# Patient Record
Sex: Male | Born: 2000
Health system: Southern US, Community
[De-identification: ages and names within clinical notes are randomized; demographics above are authoritative.]

## PROBLEM LIST (undated history)

## (undated) DIAGNOSIS — F609 Personality disorder, unspecified: Secondary | ICD-10-CM

## (undated) DIAGNOSIS — F458 Other somatoform disorders: Secondary | ICD-10-CM

## (undated) DIAGNOSIS — F429 Obsessive-compulsive disorder, unspecified: Secondary | ICD-10-CM

## (undated) DIAGNOSIS — J309 Allergic rhinitis, unspecified: Secondary | ICD-10-CM

## (undated) DIAGNOSIS — F32A Depression, unspecified: Secondary | ICD-10-CM

## (undated) DIAGNOSIS — R5383 Other fatigue: Secondary | ICD-10-CM

## (undated) DIAGNOSIS — L209 Atopic dermatitis, unspecified: Secondary | ICD-10-CM

## (undated) DIAGNOSIS — F329 Major depressive disorder, single episode, unspecified: Secondary | ICD-10-CM

## (undated) DIAGNOSIS — F909 Attention-deficit hyperactivity disorder, unspecified type: Secondary | ICD-10-CM

## (undated) DIAGNOSIS — F419 Anxiety disorder, unspecified: Secondary | ICD-10-CM

## (undated) DIAGNOSIS — R519 Headache, unspecified: Secondary | ICD-10-CM

## (undated) HISTORY — DX: Atopic dermatitis, unspecified: L20.9

## (undated) HISTORY — DX: Other somatoform disorders: F45.8

## (undated) HISTORY — DX: Obsessive-compulsive disorder, unspecified: F42.9

## (undated) HISTORY — DX: Anxiety disorder, unspecified: F41.9

## (undated) HISTORY — DX: Allergic rhinitis, unspecified: J30.9

## (undated) HISTORY — DX: Depression, unspecified: F32.A

## (undated) HISTORY — DX: Headache, unspecified: R51.9

## (undated) HISTORY — DX: Other fatigue: R53.83

## (undated) HISTORY — DX: Personality disorder, unspecified: F60.9

## (undated) HISTORY — DX: Attention-deficit hyperactivity disorder, unspecified type: F90.9

---
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
## 2021-01-11 ENCOUNTER — Ambulatory Visit: Payer: No Typology Code available for payment source | Admitting: Adult Health

## 2021-01-12 ENCOUNTER — Ambulatory Visit: Payer: No Typology Code available for payment source | Admitting: Adult Health

## 2021-02-01 DIAGNOSIS — R197 Diarrhea, unspecified: Secondary | ICD-10-CM | POA: Diagnosis not present

## 2021-02-01 DIAGNOSIS — A084 Viral intestinal infection, unspecified: Secondary | ICD-10-CM | POA: Diagnosis not present

## 2021-02-12 ENCOUNTER — Ambulatory Visit (INDEPENDENT_AMBULATORY_CARE_PROVIDER_SITE_OTHER): Payer: No Typology Code available for payment source | Admitting: Adult Health

## 2021-02-12 ENCOUNTER — Other Ambulatory Visit: Payer: Self-pay

## 2021-02-12 ENCOUNTER — Encounter: Payer: Self-pay | Admitting: Adult Health

## 2021-02-12 DIAGNOSIS — F605 Obsessive-compulsive personality disorder: Secondary | ICD-10-CM | POA: Diagnosis not present

## 2021-02-12 DIAGNOSIS — F900 Attention-deficit hyperactivity disorder, predominantly inattentive type: Secondary | ICD-10-CM

## 2021-02-12 DIAGNOSIS — F341 Dysthymic disorder: Secondary | ICD-10-CM | POA: Diagnosis not present

## 2021-02-12 DIAGNOSIS — R69 Illness, unspecified: Secondary | ICD-10-CM | POA: Diagnosis not present

## 2021-02-12 MED ORDER — AMPHETAMINE-DEXTROAMPHETAMINE 20 MG PO TABS: 20.0000 mg | ORAL_TABLET | Freq: Two times a day (BID) | ORAL | 0 refills | Status: DC

## 2021-02-12 MED ORDER — DESVENLAFAXINE SUCCINATE ER 50 MG PO TB24: 100.0000 mg | ORAL_TABLET | Freq: Every day | ORAL | 5 refills | Status: DC

## 2021-02-12 NOTE — Progress Notes (Signed)
Justin Hogan [id number] 2001/05/10 20 y.o.  Subjective:   Patient ID:  Justin Hogan is a 20 y.o. (DOB 09/26/2000) male.  Chief Complaint: No chief complaint on file.   HPI [place] presents to the office today for follow-up of ADHD, OCPD, learning disorder, depression.   Describes mood today as "ok". Pleasant. Mood symptoms - denies depression, anxiety, and irritability. Stating "I feel like things are tolerable right now". Decreased emotional "blunting" with decrease in Pristiq. Recent vacation - 22 day back packing trip - team building exercises. Stable interest and motivation. Taking medications as prescribed.  Energy levels "about the same". Active, has a regular exercise routine - running - hiking. Enjoys some usual interests and activities. Dating. Lives at home with mother. Family in [place]. Spending time with family. Appetite adequate. Weight stable - 197 pounds - 75". Sleeps better some nights than others. Averages 6 to 8 hours.  Focus and concentration stable. Completing tasks. Managing aspects of household. Recently graduated high school. 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  Denies AH or VH.  Previous medication trials: Vyvanse      Review of Systems:  Review of Systems  Musculoskeletal:  Negative for gait problem.  Neurological:  Negative for tremors.  Psychiatric/Behavioral:         Please refer to HPI   Medications: I have reviewed the patient's current medications.  Current Outpatient Medications  Medication Sig Dispense Refill   amphetamine-dextroamphetamine (ADDERALL) 20 MG tablet Take 1 tablet (20 mg total) by mouth 2 (two) times daily with breakfast and lunch. 60 tablet 0   [START ON 03/12/2021] amphetamine-dextroamphetamine (ADDERALL) 20 MG tablet Take 1 tablet (20 mg total) by mouth 2 (two) times daily with breakfast and lunch. 60 tablet 0   [START ON 04/09/2021] amphetamine-dextroamphetamine (ADDERALL) 20 MG  tablet Take 1 tablet (20 mg total) by mouth 2 (two) times daily with breakfast and lunch. 60 tablet 0   desvenlafaxine (PRISTIQ) 50 MG 24 hr tablet Take 2 tablets (100 mg total) by mouth daily after breakfast. 30 tablet 5   No current facility-administered medications for this visit.    Medication Side Effects: None  Allergies: No Known Allergies  Past Medical History:  Diagnosis Date   ADHD (attention deficit hyperactivity disorder)    Allergic rhinitis    Anxiety    Atopic eczema    Depression    Fatigue    Headache    Obsessive-compulsive disorder    Personality disorder (HCC)    Psychogenic constipation    Sinus headache     Past Medical History, Surgical history, Social history, and Family history were reviewed and updated as appropriate.   Please see review of systems for further details on the patient's review from today.   Objective:   Physical Exam:  There were no vitals taken for this visit.  Physical Exam Constitutional:      General: He is not in acute distress. Musculoskeletal:        General: No deformity.  Neurological:     Mental Status: He is alert and oriented to person, place, and time.     Coordination: Coordination normal.  Psychiatric:        Attention and Perception: Attention and perception normal. He does not perceive auditory or visual hallucinations.        Mood and Affect: Mood normal. Mood is not anxious or depressed. Affect is not labile, blunt, angry or inappropriate.  Speech: Speech normal.        Behavior: Behavior normal.        Thought Content: Thought content normal. Thought content is not paranoid or delusional. Thought content does not include homicidal or suicidal ideation. Thought content does not include homicidal or suicidal plan.        Cognition and Memory: Cognition and memory normal.        Judgment: Judgment normal.     Comments: Insight intact    Lab Review:  No results found for: NA, K, CL, CO2, GLUCOSE, BUN,  CREATININE, CALCIUM, PROT, ALBUMIN, AST, ALT, ALKPHOS, BILITOT, GFRNONAA, GFRAA  No results found for: WBC, RBC, HGB, HCT, PLT, MCV, MCH, MCHC, RDW, LYMPHSABS, MONOABS, EOSABS, BASOSABS  No results found for: POCLITH, LITHIUM   No results found for: PHENYTOIN, PHENOBARB, VALPROATE, CBMZ   .res Assessment: Plan:    Plan:  PDMP reviewed  1. Adderall 20mg  BID - taking as needed 2. Pristiq 50mg  daily  91/49 - 69  RTC 3 months  Patient advised to contact office with any questions, adverse effects, or acute worsening in signs and symptoms.  Discussed potential benefits, risk, and side effects of benzodiazepines to include potential risk of tolerance and dependence, as well as possible drowsiness.  Advised patient not to drive if experiencing drowsiness and to take lowest possible effective dose to minimize risk of dependence and tolerance.  Diagnoses and all orders for this visit:  Attention deficit hyperactivity disorder (ADHD), inattentive type, moderate -     desvenlafaxine (PRISTIQ) 50 MG 24 hr tablet; Take 2 tablets (100 mg total) by mouth daily after breakfast. -     amphetamine-dextroamphetamine (ADDERALL) 20 MG tablet; Take 1 tablet (20 mg total) by mouth 2 (two) times daily with breakfast and lunch. -     amphetamine-dextroamphetamine (ADDERALL) 20 MG tablet; Take 1 tablet (20 mg total) by mouth 2 (two) times daily with breakfast and lunch. -     amphetamine-dextroamphetamine (ADDERALL) 20 MG tablet; Take 1 tablet (20 mg total) by mouth 2 (two) times daily with breakfast and lunch.  Persistent depressive disorder with anxious distress, currently moderate -     desvenlafaxine (PRISTIQ) 50 MG 24 hr tablet; Take 2 tablets (100 mg total) by mouth daily after breakfast.  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    Please see After Visit Summary  for patient specific instructions.  No future appointments.  No orders of the defined types were placed in this encounter.   -------------------------------

## 2021-05-14 ENCOUNTER — Other Ambulatory Visit: Payer: Self-pay

## 2021-05-14 ENCOUNTER — Ambulatory Visit (INDEPENDENT_AMBULATORY_CARE_PROVIDER_SITE_OTHER): Payer: No Typology Code available for payment source | Admitting: Adult Health

## 2021-05-14 ENCOUNTER — Encounter: Payer: Self-pay | Admitting: Adult Health

## 2021-05-14 DIAGNOSIS — F810 Specific reading disorder: Secondary | ICD-10-CM | POA: Diagnosis not present

## 2021-05-14 DIAGNOSIS — F341 Dysthymic disorder: Secondary | ICD-10-CM | POA: Diagnosis not present

## 2021-05-14 DIAGNOSIS — F605 Obsessive-compulsive personality disorder: Secondary | ICD-10-CM | POA: Diagnosis not present

## 2021-05-14 DIAGNOSIS — F900 Attention-deficit hyperactivity disorder, predominantly inattentive type: Secondary | ICD-10-CM

## 2021-05-14 DIAGNOSIS — R69 Illness, unspecified: Secondary | ICD-10-CM | POA: Diagnosis not present

## 2021-05-14 MED ORDER — AMPHETAMINE-DEXTROAMPHETAMINE 20 MG PO TABS: 20.0000 mg | ORAL_TABLET | Freq: Two times a day (BID) | ORAL | 0 refills | Status: DC

## 2021-05-14 MED ORDER — DESVENLAFAXINE SUCCINATE ER 50 MG PO TB24: 100.0000 mg | ORAL_TABLET | Freq: Every day | ORAL | 5 refills | Status: DC

## 2021-05-14 NOTE — Progress Notes (Signed)
Justin Hogan [id number] 08-Apr-2001 20 y.o.  Subjective:   Patient ID:  Justin Hogan is a 20 y.o. (DOB Aug 16, 2000) male.  Chief Complaint: No chief complaint on file.   HPI [place] K[place] presents to the office today for follow-up of ADHD, OCPD, learning disorder, depression.  Describes mood today as "ok". Pleasant. Mood symptoms - reports depression - sometimes, anxiety - a little bit, and irritability - not so much. Reports intrusive thoughts. Mood is "level". Stating "I think I'm doing ok". Feels like medications continue to work well - using Adderall as needed for class work. Struggling with figuring out what makes him satisfied with his life. Hears other talking about the fun they have. Doesn't have a lot of friends - making a goal to meet people. Stable interest and motivation. Taking medications as prescribed.  Energy levels "about the same". Active, has a regular exercise routine - running - hiking. Enjoys some usual interests and activities. Dating. Lives at home with mother. [place] in [place]. Spending time with family. Appetite adequate. Weight stable - 200 pounds - 75". Sleeps better some nights than others. Averages 6 to 8 hours.  Focus and concentration stable. Completing tasks. Managing aspects of household. 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 Attends [employer] school. 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  Denies AH or VH.  Previous medication trials: Vyvanse   Review of Systems:  Review of Systems  Musculoskeletal:  Negative for gait problem.  Neurological:  Negative for tremors.  Psychiatric/Behavioral:         Please refer to HPI   Medications: I have reviewed the patient's current medications.  Current Outpatient Medications  Medication Sig Dispense Refill   amphetamine-dextroamphetamine (ADDERALL) 20 MG tablet Take 1 tablet (20 mg total) by mouth 2 (two) times daily with breakfast and lunch. 60 tablet 0   [START ON 06/11/2021] amphetamine-dextroamphetamine  (ADDERALL) 20 MG tablet Take 1 tablet (20 mg total) by mouth 2 (two) times daily with breakfast and lunch. 60 tablet 0   [START ON 07/09/2021] amphetamine-dextroamphetamine (ADDERALL) 20 MG tablet Take 1 tablet (20 mg total) by mouth 2 (two) times daily with breakfast and lunch. 60 tablet 0   desvenlafaxine (PRISTIQ) 50 MG 24 hr tablet Take 2 tablets (100 mg total) by mouth daily after breakfast. 30 tablet 5   No current facility-administered medications for this visit.    Medication Side Effects: None  Allergies: No Known Allergies  Past Medical History:  Diagnosis Date   ADHD (attention deficit hyperactivity disorder)    Allergic rhinitis    Anxiety    Atopic eczema    Depression    Fatigue    Headache    Obsessive-compulsive disorder    Personality disorder (HCC)    Psychogenic constipation    Sinus headache     Past Medical History, Surgical history, Social history, and Family history were reviewed and updated as appropriate.   Please see review of systems for further details on the patient's review from today.   Objective:   Physical Exam:  There were no vitals taken for this visit.  Physical Exam Constitutional:      General: He is not in acute distress. Musculoskeletal:        General: No deformity.  Neurological:     Mental Status: He is alert and oriented to person, place, and time.     Coordination: Coordination normal.  Psychiatric:        Attention and Perception: Attention and perception normal. He does not perceive  auditory or visual hallucinations.        Mood and Affect: Mood normal. Mood is not anxious or depressed. Affect is not labile, blunt, angry or inappropriate.        Speech: Speech normal.        Behavior: Behavior normal.        Thought Content: Thought content normal. Thought content is not paranoid or delusional. Thought content does not include homicidal or suicidal ideation. Thought content does not include homicidal or suicidal plan.         Cognition and Memory: Cognition and memory normal.        Judgment: Judgment normal.     Comments: Insight intact    Lab Review:  No results found for: NA, K, CL, CO2, GLUCOSE, BUN, CREATININE, CALCIUM, PROT, ALBUMIN, AST, ALT, ALKPHOS, BILITOT, GFRNONAA, GFRAA  No results found for: WBC, RBC, HGB, HCT, PLT, MCV, MCH, MCHC, RDW, LYMPHSABS, MONOABS, EOSABS, BASOSABS  No results found for: POCLITH, LITHIUM   No results found for: PHENYTOIN, PHENOBARB, VALPROATE, CBMZ   .res Assessment: Plan:    Plan:  PDMP reviewed  1. Adderall 20mg  BID - taking as needed 2. Pristiq 50mg  daily  101/70/63  RTC 3 months  Patient advised to contact office with any questions, adverse effects, or acute worsening in signs and symptoms.  Discussed potential benefits, risks, and side effects of stimulants with patient to include increased heart rate, palpitations, insomnia, increased anxiety, increased irritability, or decreased appetite.  Instructed patient to contact office if experiencing any significant tolerability issues.   Diagnoses and all orders for this visit:  Specific learning disorder with reading impairment  Attention deficit hyperactivity disorder (ADHD), inattentive type, moderate -     amphetamine-dextroamphetamine (ADDERALL) 20 MG tablet; Take 1 tablet (20 mg total) by mouth 2 (two) times daily with breakfast and lunch. -     amphetamine-dextroamphetamine (ADDERALL) 20 MG tablet; Take 1 tablet (20 mg total) by mouth 2 (two) times daily with breakfast and lunch. -     amphetamine-dextroamphetamine (ADDERALL) 20 MG tablet; Take 1 tablet (20 mg total) by mouth 2 (two) times daily with breakfast and lunch. -     desvenlafaxine (PRISTIQ) 50 MG 24 hr tablet; Take 2 tablets (100 mg total) by mouth daily after breakfast.  Persistent depressive disorder with anxious distress, currently moderate -     desvenlafaxine (PRISTIQ) 50 MG 24 hr tablet; Take 2 tablets (100 mg total) by mouth daily  after breakfast.  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    Please see After Visit Summary for patient specific instructions.  No future appointments.  No orders of the defined types were placed in this encounter.   -------------------------------

## 2021-06-14 DIAGNOSIS — R69 Illness, unspecified: Secondary | ICD-10-CM | POA: Diagnosis not present

## 2021-06-14 DIAGNOSIS — J309 Allergic rhinitis, unspecified: Secondary | ICD-10-CM | POA: Diagnosis not present

## 2021-06-14 DIAGNOSIS — E559 Vitamin D deficiency, unspecified: Secondary | ICD-10-CM | POA: Diagnosis not present

## 2021-06-14 DIAGNOSIS — H6692 Otitis media, unspecified, left ear: Secondary | ICD-10-CM | POA: Diagnosis not present

## 2021-06-14 DIAGNOSIS — Z0001 Encounter for general adult medical examination with abnormal findings: Secondary | ICD-10-CM | POA: Diagnosis not present

## 2021-07-06 DIAGNOSIS — E039 Hypothyroidism, unspecified: Secondary | ICD-10-CM | POA: Diagnosis not present

## 2021-08-13 ENCOUNTER — Encounter: Payer: Self-pay | Admitting: Adult Health

## 2021-08-13 ENCOUNTER — Other Ambulatory Visit: Payer: Self-pay

## 2021-08-13 ENCOUNTER — Ambulatory Visit (INDEPENDENT_AMBULATORY_CARE_PROVIDER_SITE_OTHER): Payer: No Typology Code available for payment source | Admitting: Adult Health

## 2021-08-13 DIAGNOSIS — F900 Attention-deficit hyperactivity disorder, predominantly inattentive type: Secondary | ICD-10-CM

## 2021-08-13 DIAGNOSIS — F605 Obsessive-compulsive personality disorder: Secondary | ICD-10-CM

## 2021-08-13 DIAGNOSIS — F810 Specific reading disorder: Secondary | ICD-10-CM | POA: Diagnosis not present

## 2021-08-13 DIAGNOSIS — R69 Illness, unspecified: Secondary | ICD-10-CM | POA: Diagnosis not present

## 2021-08-13 DIAGNOSIS — F341 Dysthymic disorder: Secondary | ICD-10-CM | POA: Diagnosis not present

## 2021-08-13 MED ORDER — DESVENLAFAXINE SUCCINATE ER 50 MG PO TB24: ORAL_TABLET | ORAL | 5 refills | Status: DC

## 2021-08-13 MED ORDER — AMPHETAMINE-DEXTROAMPHETAMINE 20 MG PO TABS: 20.0000 mg | ORAL_TABLET | Freq: Two times a day (BID) | ORAL | 0 refills | Status: DC

## 2021-08-13 NOTE — Progress Notes (Signed)
Justin Hogan [id number] 2001/06/16 21 y.o.  Subjective:   Patient ID:  Justin Hogan is a 21 y.o. (DOB 21-Mar-2001) male.  Chief Complaint: No chief complaint on file.   HPI Mirac Mctiernan presents to the office today for follow-up of ADHD, OCPD, learning disorder, depression.   Describes mood today as "ok". Pleasant. Mood symptoms - reports seasonal depression, anxiety, and irritability. Stating "I have been somewhat down". Reports some emotional blunting. Continues to take the Pristiq 50mg  every morning and Adderall. Starting classes at [employer] on January 09. Mother has taken a job that will require her to move. Cousin planning to move in with him. Varying interest and motivation. Taking medications as prescribed.  Energy levels lower. Active, does not have a regular exercise routine - plans to start. Enjoys some usual interests and activities. Lives at home with mother. Family in [place]. Spending time with family. Appetite adequate. Weight stable -215 pounds - 75". Sleeps better some nights than others. Averages 6 to 8 hours.  Focus and concentration stable. Completing tasks. Managing aspects of household. High school graduate. 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  Denies AH or VH.  Previous medication trials: Vyvanse  Review of Systems:  Review of Systems  Musculoskeletal:  Negative for gait problem.  Neurological:  Negative for tremors.  Psychiatric/Behavioral:         Please refer to HPI   Medications: I have reviewed the patient's current medications.  Current Outpatient Medications  Medication Sig Dispense Refill   amphetamine-dextroamphetamine (ADDERALL) 20 MG tablet Take 1 tablet (20 mg total) by mouth 2 (two) times daily with breakfast and lunch. 60 tablet 0   [START ON 09/10/2021] amphetamine-dextroamphetamine (ADDERALL) 20 MG tablet Take 1 tablet (20 mg total) by mouth 2 (two) times daily with breakfast and lunch. 60 tablet 0   [START ON 10/08/2021]  amphetamine-dextroamphetamine (ADDERALL) 20 MG tablet Take 1 tablet (20 mg total) by mouth 2 (two) times daily with breakfast and lunch. 60 tablet 0   desvenlafaxine (PRISTIQ) 50 MG 24 hr tablet Take one tablet every morning. 30 tablet 5   No current facility-administered medications for this visit.    Medication Side Effects: None  Allergies: No Known Allergies  Past Medical History:  Diagnosis Date   ADHD (attention deficit hyperactivity disorder)    Allergic rhinitis    Anxiety    Atopic eczema    Depression    Fatigue    Headache    Obsessive-compulsive disorder    Personality disorder (HCC)    Psychogenic constipation    Sinus headache     Past Medical History, Surgical history, Social history, and Family history were reviewed and updated as appropriate.   Please see review of systems for further details on the patient's review from today.   Objective:   Physical Exam:  There were no vitals taken for this visit.  Physical Exam Constitutional:      General: He is not in acute distress. Musculoskeletal:        General: No deformity.  Neurological:     Mental Status: He is alert and oriented to person, place, and time.     Coordination: Coordination normal.  Psychiatric:        Attention and Perception: Attention and perception normal. He does not perceive auditory or visual hallucinations.        Mood and Affect: Mood normal. Mood is not anxious or depressed. Affect is not labile, blunt, angry or inappropriate.  Speech: Speech normal.        Behavior: Behavior normal.        Thought Content: Thought content normal. Thought content is not paranoid or delusional. Thought content does not include homicidal or suicidal ideation. Thought content does not include homicidal or suicidal plan.        Cognition and Memory: Cognition and memory normal.        Judgment: Judgment normal.     Comments: Insight intact    Lab Review:  No results found for: NA, K, CL,  CO2, GLUCOSE, BUN, CREATININE, CALCIUM, PROT, ALBUMIN, AST, ALT, ALKPHOS, BILITOT, GFRNONAA, GFRAA  No results found for: WBC, RBC, HGB, HCT, PLT, MCV, MCH, MCHC, RDW, LYMPHSABS, MONOABS, EOSABS, BASOSABS  No results found for: POCLITH, LITHIUM   No results found for: PHENYTOIN, PHENOBARB, VALPROATE, CBMZ   .res Assessment: Plan:    Plan:  PDMP reviewed  1. Adderall 20mg  BID - taking as needed 2. Pristiq 50mg  daily  107/55/64  RTC 3 months  Patient advised to contact office with any questions, adverse effects, or acute worsening in signs and symptoms.  Discussed potential benefits, risks, and side effects of stimulants with patient to include increased heart rate, palpitations, insomnia, increased anxiety, increased irritability, or decreased appetite.  Instructed patient to contact office if experiencing any significant tolerability issues.  Diagnoses and all orders for this visit:  Specific learning disorder with reading impairment  Attention deficit hyperactivity disorder (ADHD), inattentive type, moderate -     amphetamine-dextroamphetamine (ADDERALL) 20 MG tablet; Take 1 tablet (20 mg total) by mouth 2 (two) times daily with breakfast and lunch. -     amphetamine-dextroamphetamine (ADDERALL) 20 MG tablet; Take 1 tablet (20 mg total) by mouth 2 (two) times daily with breakfast and lunch. -     amphetamine-dextroamphetamine (ADDERALL) 20 MG tablet; Take 1 tablet (20 mg total) by mouth 2 (two) times daily with breakfast and lunch. -     desvenlafaxine (PRISTIQ) 50 MG 24 hr tablet; Take one tablet every morning.  Persistent depressive disorder with anxious distress, currently moderate -     desvenlafaxine (PRISTIQ) 50 MG 24 hr tablet; Take one tablet every morning.  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     Please see After Visit Summary for patient specific  instructions.  No future appointments.  No orders of the defined types were placed in this encounter.   -------------------------------

## 2021-08-15 ENCOUNTER — Telehealth: Payer: Self-pay

## 2021-08-15 NOTE — Telephone Encounter (Signed)
Prior Authorization submitted and approved for DESVENLAFAXINE 50 MG #30 effective 08/15/2021-08/15/2022 with [facility]

## 2021-09-22 DIAGNOSIS — J020 Streptococcal pharyngitis: Secondary | ICD-10-CM | POA: Diagnosis not present

## 2021-09-27 DIAGNOSIS — E039 Hypothyroidism, unspecified: Secondary | ICD-10-CM | POA: Diagnosis not present

## 2021-10-04 DIAGNOSIS — J029 Acute pharyngitis, unspecified: Secondary | ICD-10-CM | POA: Diagnosis not present

## 2021-10-04 DIAGNOSIS — J0120 Acute ethmoidal sinusitis, unspecified: Secondary | ICD-10-CM | POA: Diagnosis not present

## 2021-10-04 DIAGNOSIS — J309 Allergic rhinitis, unspecified: Secondary | ICD-10-CM | POA: Diagnosis not present

## 2021-10-04 DIAGNOSIS — Z20822 Contact with and (suspected) exposure to covid-19: Secondary | ICD-10-CM | POA: Diagnosis not present

## 2021-10-04 DIAGNOSIS — R69 Illness, unspecified: Secondary | ICD-10-CM | POA: Diagnosis not present

## 2021-11-11 ENCOUNTER — Ambulatory Visit (INDEPENDENT_AMBULATORY_CARE_PROVIDER_SITE_OTHER): Payer: No Typology Code available for payment source | Admitting: Adult Health

## 2021-11-11 ENCOUNTER — Encounter: Payer: Self-pay | Admitting: Adult Health

## 2021-11-11 DIAGNOSIS — F900 Attention-deficit hyperactivity disorder, predominantly inattentive type: Secondary | ICD-10-CM

## 2021-11-11 DIAGNOSIS — R69 Illness, unspecified: Secondary | ICD-10-CM | POA: Diagnosis not present

## 2021-11-11 DIAGNOSIS — F605 Obsessive-compulsive personality disorder: Secondary | ICD-10-CM

## 2021-11-11 DIAGNOSIS — F341 Dysthymic disorder: Secondary | ICD-10-CM

## 2021-11-11 MED ORDER — AMPHETAMINE-DEXTROAMPHETAMINE 20 MG PO TABS: 20.0000 mg | ORAL_TABLET | Freq: Two times a day (BID) | ORAL | 0 refills | Status: DC

## 2021-11-11 MED ORDER — DESVENLAFAXINE SUCCINATE ER 50 MG PO TB24: ORAL_TABLET | ORAL | 5 refills | Status: DC

## 2021-11-11 NOTE — Progress Notes (Signed)
Justin Hogan ?[id number] ?2001-06-14 ?21 y.o. ? ?Subjective:  ? ?Patient ID:  Justin Hogan is a 21 y.o. (DOB Jun 22, 2001) male. ? ?Chief Complaint: No chief complaint on file. ? ? ?HPI ?[name] presents to the office today for follow-up of ADHD, OCPD, learning disorder, depression. ? ? ?Describes mood today as "ok". Pleasant. Mood symptoms - reports depression - "a little bit", anxiety - "last week", and irritability - "not really". Stating "I feel like I'm doing ok". Feels like medications are working well for him. 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 Living with mother. Mother still local - did not accept job offer as expected. Cousin planning to move in with him. Varying interest and motivation. Taking medications as prescribed.  ?Energy levels lower. Active, does not have a regular exercise routine. ?Enjoys some usual interests and activities. Lives at home with mother. Family in [place]. Spending time with family. ?Appetite adequate. Weight stable - 215 pounds - 75". ?Sleeps better some nights than others. Averages 6 to 8 hours.  ?Focus and concentration stable. Completing tasks. Managing aspects of household. High school graduate. Working part time Surveyor, [employer]. ?Denies SI or HI.  ?Denies AH or VH. ? ?Previous medication trials: Vyvanse ?  ?Review of Systems:  ?Review of Systems  ?Musculoskeletal:  Negative for gait problem.  ?Neurological:  Negative for tremors.  ?Psychiatric/Behavioral:    ?     Please refer to HPI  ? ?Medications: I have reviewed the patient's current medications. ? ?Current Outpatient Medications  ?Medication Sig Dispense Refill  ? amphetamine-dextroamphetamine (ADDERALL) 20 MG tablet Take 1 tablet (20 mg total) by mouth 2 (two) times daily with breakfast and lunch. 60 tablet 0  ? amphetamine-dextroamphetamine (ADDERALL) 20 MG tablet Take 1 tablet (20 mg total) by mouth 2 (two) times daily with breakfast and lunch. 60 tablet 0  ?  amphetamine-dextroamphetamine (ADDERALL) 20 MG tablet Take 1 tablet (20 mg total) by mouth 2 (two) times daily with breakfast and lunch. 60 tablet 0  ? desvenlafaxine (PRISTIQ) 50 MG 24 hr tablet Take one tablet every morning. 30 tablet 5  ? ?No current facility-administered medications for this visit.  ? ? ?Medication Side Effects: None ? ?Allergies: No Known Allergies ? ?Past Medical History:  ?Diagnosis Date  ? ADHD (attention deficit hyperactivity disorder)   ? Allergic rhinitis   ? Anxiety   ? Atopic eczema   ? Depression   ? Fatigue   ? Headache   ? Obsessive-compulsive disorder   ? Personality disorder (HCC)   ? Psychogenic constipation   ? Sinus headache   ? ? ?Past Medical History, Surgical history, Social history, and Family history were reviewed and updated as appropriate.  ? ?Please see review of systems for further details on the patient's review from today.  ? ?Objective:  ? ?Physical Exam:  ?There were no vitals taken for this visit. ? ?Physical Exam ?Constitutional:   ?   General: He is not in acute distress. ?Musculoskeletal:     ?   General: No deformity.  ?Neurological:  ?   Mental Status: He is alert and oriented to person, place, and time.  ?   Coordination: Coordination normal.  ?Psychiatric:     ?   Attention and Perception: Attention and perception normal. He does not perceive auditory or visual hallucinations.     ?   Mood and Affect: Mood normal. Mood is not anxious or depressed. Affect is not labile, blunt, angry or inappropriate.     ?  Speech: Speech normal.     ?   Behavior: Behavior normal.     ?   Thought Content: Thought content normal. Thought content is not paranoid or delusional. Thought content does not include homicidal or suicidal ideation. Thought content does not include homicidal or suicidal plan.     ?   Cognition and Memory: Cognition and memory normal.     ?   Judgment: Judgment normal.  ?   Comments: Insight intact  ? ? ?Lab Review:  ?No results found for: NA, K, CL,  CO2, GLUCOSE, BUN, CREATININE, CALCIUM, PROT, ALBUMIN, AST, ALT, ALKPHOS, BILITOT, GFRNONAA, GFRAA ? ?No results found for: WBC, RBC, HGB, HCT, PLT, MCV, MCH, MCHC, RDW, LYMPHSABS, MONOABS, EOSABS, BASOSABS ? ?No results found for: POCLITH, LITHIUM  ? ?No results found for: PHENYTOIN, PHENOBARB, VALPROATE, CBMZ  ? ?.res ?Assessment: Plan:   ? ?Assessment: Plan:   ? ?Plan: ? ?PDMP reviewed ? ?1. Adderall 20mg  BID - taking as needed ?2. Pristiq 50mg  daily ? ?110/55/66 ? ?RTC 3 months ? ?Patient advised to contact office with any questions, adverse effects, or acute worsening in signs and symptoms. ? ?Discussed potential benefits, risks, and side effects of stimulants with patient to include increased heart rate, palpitations, insomnia, increased anxiety, increased irritability, or decreased appetite.  Instructed patient to contact office if experiencing any significant tolerability issues.  ? ?There are no diagnoses linked to this encounter.  ? ?Please see After Visit Summary for patient specific instructions. ? ?No future appointments. ? ?No orders of the defined types were placed in this encounter. ? ? ?------------------------------- ?

## 2021-11-25 DIAGNOSIS — R946 Abnormal results of thyroid function studies: Secondary | ICD-10-CM | POA: Diagnosis not present

## 2021-11-25 DIAGNOSIS — E559 Vitamin D deficiency, unspecified: Secondary | ICD-10-CM | POA: Diagnosis not present

## 2021-11-25 DIAGNOSIS — E039 Hypothyroidism, unspecified: Secondary | ICD-10-CM | POA: Diagnosis not present

## 2021-11-25 DIAGNOSIS — R5383 Other fatigue: Secondary | ICD-10-CM | POA: Diagnosis not present

## 2021-11-25 DIAGNOSIS — R69 Illness, unspecified: Secondary | ICD-10-CM | POA: Diagnosis not present

## 2022-01-02 ENCOUNTER — Other Ambulatory Visit: Payer: Self-pay

## 2022-01-02 ENCOUNTER — Ambulatory Visit (HOSPITAL_COMMUNITY)
Admission: EM | Admit: 2022-01-02 | Discharge: 2022-01-02 | Disposition: A | Discharge status: Home / Self Care | Payer: Managed Care, Other (non HMO) | Attending: Internal Medicine | Admitting: Internal Medicine

## 2022-01-02 ENCOUNTER — Encounter (HOSPITAL_COMMUNITY): Payer: Self-pay | Admitting: *Deleted

## 2022-01-02 DIAGNOSIS — J301 Allergic rhinitis due to pollen: Secondary | ICD-10-CM

## 2022-01-02 DIAGNOSIS — R1031 Right lower quadrant pain: Secondary | ICD-10-CM

## 2022-01-02 DIAGNOSIS — N50811 Right testicular pain: Secondary | ICD-10-CM

## 2022-01-02 MED ORDER — FLUTICASONE PROPIONATE 50 MCG/ACT NA SUSP: 1.0000 | Freq: Every day | NASAL | 2 refills | Status: AC

## 2022-01-02 MED ORDER — IBUPROFEN 800 MG PO TABS: 800.0000 mg | ORAL_TABLET | Freq: Three times a day (TID) | ORAL | 0 refills | Status: AC

## 2022-01-02 MED ORDER — CETIRIZINE HCL 10 MG PO TABS: 10.0000 mg | ORAL_TABLET | Freq: Every day | ORAL | 2 refills | Status: AC

## 2022-01-02 NOTE — ED Provider Notes (Signed)
[facility]    CSN: [id number] Arrival date & time: 01/02/22  1611      History   Chief Complaint Chief Complaint  Patient presents with   Nasal Congestion   cotton throat.   Testicle Pain   Abdominal Pain    HPI Justin Hogan is a 21 y.o. male.   Patient presents to urgent care for evaluation of right lower abdominal pain and right testicular pain. He was on his back pushing a transmission into a truck with his hands and feet last week on Thursday Dec 30, 2021 at school. Pain started last night suddenly in the middle of the night and woke him up from his sleep. Denies surgical history to his abdomen.  He has inspected his testicle and states he has not felt anything abnormal, but states that he has pain when he lightly touches his right testicle. He does not have pain to right testicle when he is sitting down or when he is not moving, but when he stands up or moves his legs, his testicle hurts. When the testicle hurts, he states it is a 4 on a scale of 0-10. He describes pain as intermittent and sometimes it is a "shooting pain that goes from the right testicle to the abdomen". He wears supportive underwear. He has tried taking ibuprofen for pain with minimal relief. Denies penile discharge and urinary symptoms. No other aggravating or relieving factors identified at this time.   Testicle Pain Associated symptoms include abdominal pain.  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 No pertinent surgical history.     Home Medications    Prior to Admission medications   Medication Sig Start Date End Date Taking? Authorizing Provider  cetirizine (ZYRTEC) 10 MG tablet Take 1 tablet (10 mg total) by mouth daily. 01/02/22  Yes Carlisle BeersStanhope, Maricia Scotti M, FNP  fluticasone (FLONASE) 50 MCG/ACT nasal spray Place 1 spray into both nostrils daily. 01/02/22  Yes Carlisle BeersStanhope, Jordyan Hardiman M, FNP  ibuprofen (ADVIL) 800 MG tablet Take 1 tablet (800 mg total) by mouth 3 (three) times daily. 01/02/22  Yes Carlisle BeersStanhope, Floyd Wade M, FNP  amphetamine-dextroamphetamine (ADDERALL) 20 MG tablet Take 1 tablet (20 mg total) by mouth 2 (two) times daily with breakfast and lunch. 11/11/21 12/11/21  Mozingo, Thereasa Soloegina Nattalie, NP  amphetamine-dextroamphetamine (ADDERALL) 20 MG tablet Take 1 tablet (20 mg total) by mouth 2 (two) times daily with breakfast and lunch. 11/11/21 12/11/21  Mozingo, Thereasa Soloegina Nattalie, NP  amphetamine-dextroamphetamine (ADDERALL) 20 MG tablet Take 1 tablet (20 mg total) by mouth 2 (two) times daily with breakfast and lunch. 11/11/21 12/11/21  Mozingo, Thereasa Soloegina Nattalie, NP  desvenlafaxine (PRISTIQ) 50 MG 24 hr tablet Take one tablet every morning. 11/11/21   Mozingo, Thereasa Soloegina Nattalie, NP    Family History Family History  Problem Relation Age of Onset   Post-traumatic  stress disorder Father    Personality disorder Father    ADD / ADHD Father    Anxiety disorder Maternal Grandmother    Anxiety disorder Maternal Grandfather     Social History Social History   Tobacco Use   Smoking status: Never   Smokeless tobacco: Never  Vaping Use   Vaping Use: Never used  Substance Use Topics   Alcohol use: Never   Drug use: Never     Allergies   Patient has no known allergies.   Review of Systems Review of Systems  Gastrointestinal:   Positive for abdominal pain.  Genitourinary:  Positive for testicular pain.  Per HPI  Physical Exam Triage Vital Signs ED Triage Vitals  Enc Vitals Group     BP 01/02/22 1643 140/69     Pulse Rate 01/02/22 1643 70     Resp 01/02/22 1643 18     Temp 01/02/22 1643 98.7 F (37.1 C)     Temp src --      SpO2 01/02/22 1643 98 %     Weight --      Height --      Head Circumference --      Peak Flow --      Pain Score 01/02/22 1641 4     Pain Loc --      Pain Edu? --      Excl. in GC? --    No data found.  Updated Vital Signs BP 140/69   Pulse 70   Temp 98.7 F (37.1 C)   Resp 18   SpO2 98%   Visual Acuity Right Eye Distance:   Left Eye Distance:   Bilateral Distance:    Right Eye Near:   Left Eye Near:    Bilateral Near:     Physical Exam Vitals and nursing note reviewed. Exam conducted with a chaperone present [facility], CMA).  Constitutional:      General: He is not in acute distress.    Appearance: Normal appearance. He is well-developed. He is not ill-appearing.     Comments: Very pleasant patient sitting comfortably on exam able in no acute distress.   HENT:     Head: Normocephalic and atraumatic.     Right Ear: Tympanic membrane, ear canal and external ear normal.     Left Ear: Tympanic membrane, ear canal and external ear normal.     Nose: Nose normal.     Right Turbinates: Swollen and pale.     Left Turbinates: Swollen and pale.     Mouth/Throat:     Mouth: Mucous membranes are moist.     Pharynx: No posterior oropharyngeal erythema.     Comments: Mild erythema to posterior oropharynx with small amount of clear postnasal drainage visualized. Airway intact and patent. Eyes:     General: Lids are normal. Vision grossly intact. Gaze aligned appropriately.     Extraocular Movements: Extraocular movements intact.     Conjunctiva/sclera: Conjunctivae normal.     Right eye: Right conjunctiva is not injected.     Left eye: Left conjunctiva is not injected.   Cardiovascular:     Rate and Rhythm: Normal rate and regular rhythm.     Heart sounds: Normal heart sounds, S1 normal and S2 normal. No murmur heard. Pulmonary:     Effort: Pulmonary effort is normal. No respiratory distress.     Breath sounds: Normal breath sounds. No decreased air movement.  Abdominal:     General: Abdomen is flat. Bowel sounds  are normal. There is no distension. There are no signs of injury.     Palpations: Abdomen is soft.     Tenderness: There is abdominal tenderness in the suprapubic area. There is no right CVA tenderness or left CVA tenderness.     Hernia: No hernia is present. There is no hernia in the left inguinal area or right inguinal area.  Genitourinary:    Penis: Normal and circumcised. No discharge.      Testes: Normal.        Right: Mass, tenderness, swelling, testicular hydrocele or varicocele not present.        Left: Mass, tenderness, swelling, testicular hydrocele or varicocele not present.  Musculoskeletal:        General: No swelling.     Cervical back: Neck supple.     Right lower leg: No edema.     Left lower leg: No edema.  Lymphadenopathy:     Cervical: No cervical adenopathy.     Lower Body: No right inguinal adenopathy. No left inguinal adenopathy.  Skin:    General: Skin is warm and dry.     Capillary Refill: Capillary refill takes less than 2 seconds.     Findings: No rash.  Neurological:     General: No focal deficit present.     Mental Status: He is alert and oriented to person, place, and time. Mental status is at baseline.     Gait: Gait is intact.  Psychiatric:        Attention and Perception: Attention and perception normal.        Mood and Affect: Mood normal.        Speech: Speech normal.        Behavior: Behavior normal. Behavior is cooperative.        Thought Content: Thought content normal.        Cognition and Memory: Cognition and memory normal.        Judgment: Judgment normal.     UC Treatments / Results   Labs (all labs ordered are listed, but only abnormal results are displayed) Labs Reviewed - No data to display  EKG   Radiology No results found.  Procedures Procedures (including critical care time)  Medications Ordered in UC Medications - No data to display  Initial Impression / Assessment and Plan / UC Course  I have reviewed the triage vital signs and the nursing notes.  Pertinent labs & imaging results that were available during my care of the patient were reviewed by me and considered in my medical decision making (see chart for details).  Patient is a 21 year old male presenting to urgent care with right sided testicular and abdominal pain. Low suspicion for testicular torsion at this time as patient is non-tender to palpation of right testicle on exam, there is no swelling, and symptoms and his subjective pain reported is mild. Low suspicion for epididymitis as patient does not report or deny improvement of symptoms with manually lifting right testicle compared to allowing testicle to hang. Testicular and abdominal pain is likely inflammatory in nature and will respond well to a higher and more consistent dose of ibuprofen. Patient prescribed ibuprofen  to be taken every 8 hours for the next 2-3 days with food to decrease inflammation. Advised patient to not lift anything heavier than 10 pounds for the next 5-7 days and wear supportive underwear for 5-7 days as well.   At discharge, patient requesting to be evaluated for the "weird feeling" in  his throat. Physical exam shows mildly erythematous posterior oropharynx with post-nasal drainage and swollen/pale nasal turbinates with rhinorrhea. Patient prescribed Flonase 1 spray into each nostril once daily and cetirizine once daily for allergic rhinitis symptoms. Low suspicion for group A strep cause to patient's throat pain at this time as he is afebrile at home and in clinic.   Counseled patient regarding appropriate use of  medications and potential side effects for all medications recommended or prescribed today. Discussed red flag signs and symptoms of worsening condition,when to call the PCP office, return to urgent care, and when to seek higher level of care. Patient verbalizes understanding and agreement with plan. All questions answered. Patient discharged in stable condition.  Final Clinical Impressions(s) / UC Diagnoses   Final diagnoses:  Testicular pain, right  Right lower quadrant abdominal pain     Discharge Instructions      You were seen in urgent care today for testicular pain.  This is likely due to inflammation and strain from physical activity last week.  Avoid heavy lifting for the next week to allow space inflammation to decrease.  Take ibuprofen 800 mg every 8 hours with food for the next 2 to 3 days to decrease inflammation.   If you develop any new or worsening symptoms or do not improve in the next 2 to 3 days, please return.  If your symptoms are severe, please go to the emergency room.  Follow-up with your primary care provider for further evaluation and management of your symptoms as well as ongoing wellness visits.  I hope you feel better!     ED Prescriptions     Medication Sig Dispense Auth. Provider   ibuprofen (ADVIL) 800 MG tablet Take 1 tablet (800 mg total) by mouth 3 (three) times daily. 21 tablet Carlisle Beers, FNP   cetirizine (ZYRTEC) 10 MG tablet Take 1 tablet (10 mg total) by mouth daily. 30 tablet Reita May M, FNP   fluticasone [facility]) 50 MCG/ACT nasal spray Place 1 spray into both nostrils daily. 16 g Carlisle Beers, FNP      PDMP not reviewed this encounter.   Carlisle Beers, [place] 01/04/22 2224

## 2022-01-02 NOTE — Discharge Instructions (Addendum)
You were seen in urgent care today for testicular pain.  This is likely due to inflammation and strain from physical activity last week.  Avoid heavy lifting for the next week to allow space inflammation to decrease.  Take ibuprofen 800 mg every 8 hours with food for the next 2 to 3 days to decrease inflammation.   If you develop any new or worsening symptoms or do not improve in the next 2 to 3 days, please return.  If your symptoms are severe, please go to the emergency room.  Follow-up with your primary care provider for further evaluation and management of your symptoms as well as ongoing wellness visits.  I hope you feel better!

## 2022-01-02 NOTE — ED Triage Notes (Signed)
Pt reports RT testicle pain and Rt ABD pain that started last night. Pt also reports weird feeling in his throat.

## 2022-02-10 ENCOUNTER — Encounter: Payer: Self-pay | Admitting: Adult Health

## 2022-02-10 ENCOUNTER — Ambulatory Visit (INDEPENDENT_AMBULATORY_CARE_PROVIDER_SITE_OTHER): Payer: 59 | Admitting: Adult Health

## 2022-02-10 DIAGNOSIS — F810 Specific reading disorder: Secondary | ICD-10-CM

## 2022-02-10 DIAGNOSIS — F900 Attention-deficit hyperactivity disorder, predominantly inattentive type: Secondary | ICD-10-CM | POA: Diagnosis not present

## 2022-02-10 DIAGNOSIS — F341 Dysthymic disorder: Secondary | ICD-10-CM

## 2022-02-10 DIAGNOSIS — F605 Obsessive-compulsive personality disorder: Secondary | ICD-10-CM

## 2022-02-10 NOTE — Progress Notes (Signed)
[name] [id number] 2000-11-03 21 y.o.  Subjective:   Patient ID:  Justin Hogan is a 21 y.o. (DOB [birth year]) male.  Chief Complaint: No chief complaint on file.   HPI Jerian Morais presents to the office today for follow-up of ADHD, OCPD, learning disorder, depression.   Describes mood today as "ok". Pleasant. Mood symptoms - denies depression, anxiety and irritability. Mood is consistent. Stating "I feel like I'm doing good". Feels like medications work well. 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 Living with mother. Varying interest and motivation. Taking medications as prescribed.  Energy levels improved. Active, does not have a regular exercise routine. Enjoys some usual interests and activities. Lives at home with mother. Family in [place]. Spending time with family. Appetite adequate. Weight stable - 215 pounds - 75". Sleeps better some nights than others. Averages 6 hours during the week and longer on the weekends.  Focus and concentration stable. Completing tasks. 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  Denies AH or VH.  Previous medication trials: Vyvanse   Flowsheet Row ED from 01/02/2022 in [facility] RISK CATEGORY No Risk        Review of Systems:  Review of Systems  Musculoskeletal:  Negative for gait problem.  Neurological:  Negative for tremors.  Psychiatric/Behavioral:         Please refer to HPI    Medications: I have reviewed the patient's current medications.  Current Outpatient Medications  Medication Sig Dispense Refill   amphetamine-dextroamphetamine (ADDERALL) 20 MG tablet Take 1 tablet (20 mg total) by mouth 2 (two) times daily with breakfast and lunch. 60 tablet 0   amphetamine-dextroamphetamine (ADDERALL) 20 MG tablet Take 1 tablet (20 mg total) by mouth 2 (two) times daily with breakfast and lunch. 60  tablet 0   amphetamine-dextroamphetamine (ADDERALL) 20 MG tablet Take 1 tablet (20 mg total) by mouth 2 (two) times daily with breakfast and lunch. 60 tablet 0   cetirizine (ZYRTEC) 10 MG tablet Take 1 tablet (10 mg total) by mouth daily. 30 tablet 2   desvenlafaxine (PRISTIQ) 50 MG 24 hr tablet Take one tablet every morning. 30 tablet 5   fluticasone (FLONASE) 50 MCG/ACT nasal spray Place 1 spray into both nostrils daily. 16 g 2   ibuprofen (ADVIL) 800 MG tablet Take 1 tablet (800 mg total) by mouth 3 (three) times daily. 21 tablet 0   No current facility-administered medications for this visit.    Medication Side Effects: None  Allergies: No Known Allergies  Past Medical History:  Diagnosis Date   ADHD (attention deficit hyperactivity disorder)    Allergic rhinitis    Anxiety    Atopic eczema    Depression    Fatigue    Headache    Obsessive-compulsive disorder    Personality disorder (HCC)    Psychogenic constipation    Sinus headache     Past Medical History, Surgical history, Social history, and Family history were reviewed and updated as appropriate.   Please see review of systems for further details on the patient's review from today.   Objective:   Physical Exam:  There were no vitals taken for this visit.  Physical Exam Constitutional:      General: He is not in acute distress. Musculoskeletal:        General: No deformity.  Neurological:     Mental Status: He is alert and oriented  to person, place, and time.     Coordination: Coordination normal.  Psychiatric:        Attention and Perception: Attention and perception normal. He does not perceive auditory or visual hallucinations.        Mood and Affect: Mood normal. Mood is not anxious or depressed. Affect is not labile, blunt, angry or inappropriate.        Speech: Speech normal.        Behavior: Behavior normal.        Thought Content: Thought content normal. Thought content is not paranoid or  delusional. Thought content does not include homicidal or suicidal ideation. Thought content does not include homicidal or suicidal plan.        Cognition and Memory: Cognition and memory normal.        Judgment: Judgment normal.     Comments: Insight intact     Lab Review:  No results found for: "NA", "K", "CL", "CO2", "GLUCOSE", "BUN", "CREATININE", "CALCIUM", "PROT", "ALBUMIN", "AST", "ALT", "ALKPHOS", "BILITOT", "GFRNONAA", "GFRAA"  No results found for: "WBC", "RBC", "HGB", "HCT", "PLT", "MCV", "MCH", "MCHC", "RDW", "LYMPHSABS", "MONOABS", "EOSABS", "BASOSABS"  No results found for: "POCLITH", "LITHIUM"   No results found for: "PHENYTOIN", "PHENOBARB", "VALPROATE", "CBMZ"   .res Assessment: Plan:    Plan:  PDMP reviewed  1. Adderall 20mg  BID - not taking currently 2. Pristiq 50mg  daily  RTC 4 months  Patient advised to contact office with any questions, adverse effects, or acute worsening in signs and symptoms.  Discussed potential benefits, risks, and side effects of stimulants with patient to include increased heart rate, palpitations, insomnia, increased anxiety, increased irritability, or decreased appetite.  Instructed patient to contact office if experiencing any significant tolerability issues.   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  No future appointments.  No orders of the defined types were placed in this encounter.   -------------------------------

## 2022-06-13 ENCOUNTER — Ambulatory Visit: Payer: 59 | Admitting: Adult Health

## 2022-06-17 ENCOUNTER — Ambulatory Visit (INDEPENDENT_AMBULATORY_CARE_PROVIDER_SITE_OTHER): Payer: 59 | Admitting: Adult Health

## 2022-06-17 ENCOUNTER — Encounter: Payer: Self-pay | Admitting: Adult Health

## 2022-06-17 DIAGNOSIS — F341 Dysthymic disorder: Secondary | ICD-10-CM | POA: Diagnosis not present

## 2022-06-17 DIAGNOSIS — F900 Attention-deficit hyperactivity disorder, predominantly inattentive type: Secondary | ICD-10-CM

## 2022-06-17 DIAGNOSIS — F605 Obsessive-compulsive personality disorder: Secondary | ICD-10-CM | POA: Diagnosis not present

## 2022-06-17 DIAGNOSIS — F810 Specific reading disorder: Secondary | ICD-10-CM | POA: Diagnosis not present

## 2022-06-17 NOTE — Progress Notes (Signed)
Justin Hogan [id number] Dec 09, 2000 21 y.o.  Subjective:   Patient ID:  Justin Hogan is a 21 y.o. (DOB November 21, 2000) male.  Chief Complaint: No chief complaint on file.   HPI Justin Hogan presents to the office today for follow-up of ADHD, OCPD, learning disorder, depression.   Describes mood today as "not the best". Pleasant. Denies tearfulness. Mood symptoms - reports increased depression. Reports some anxiety. Denies irritability. Mood is lower. Stating "I have been struggling". Having issues making connections - forming friendships. Wanting to try and work with a therapist - possibly attend group therapy. Feels like the Pristiq is helpful and does not want to make any changes. 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 Living with mother - supportive. Varying interest and motivation. Taking medications as prescribed.  Energy levels varies. Active, does not have a regular exercise routine. Enjoys some usual interests and activities. Lives at home with mother. Family in [place]. Spending time with family. Appetite adequate. Weight stable - 215 pounds - 75". Sleeps better some nights than others. Averages 6 hours during the week and longer on the weekends.  Focus and concentration stable. Completing tasks. 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  Denies AH or VH.  Previous medication trials: Vyvanse   Flowsheet Row ED from 01/02/2022 in [facility] at [facility] RISK CATEGORY No Risk        Review of Systems:  Review of Systems  Musculoskeletal:  Negative for gait problem.  Neurological:  Negative for tremors.  Psychiatric/Behavioral:         Please refer to HPI    Medications: I have reviewed the patient's current medications.  Current Outpatient Medications  Medication Sig Dispense Refill   amphetamine-dextroamphetamine (ADDERALL)  20 MG tablet Take 1 tablet (20 mg total) by mouth 2 (two) times daily with breakfast and lunch. 60 tablet 0   amphetamine-dextroamphetamine (ADDERALL) 20 MG tablet Take 1 tablet (20 mg total) by mouth 2 (two) times daily with breakfast and lunch. 60 tablet 0   amphetamine-dextroamphetamine (ADDERALL) 20 MG tablet Take 1 tablet (20 mg total) by mouth 2 (two) times daily with breakfast and lunch. 60 tablet 0   cetirizine (ZYRTEC) 10 MG tablet Take 1 tablet (10 mg total) by mouth daily. 30 tablet 2   desvenlafaxine (PRISTIQ) 50 MG 24 hr tablet Take one tablet every morning. 30 tablet 5   fluticasone (FLONASE) 50 MCG/ACT nasal spray Place 1 spray into both nostrils daily. 16 g 2   ibuprofen (ADVIL) 800 MG tablet Take 1 tablet (800 mg total) by mouth 3 (three) times daily. 21 tablet 0   No current facility-administered medications for this visit.    Medication Side Effects: None  Allergies: No Known Allergies  Past Medical History:  Diagnosis Date   ADHD (attention deficit hyperactivity disorder)    Allergic rhinitis    Anxiety    Atopic eczema    Depression    Fatigue    Headache    Obsessive-compulsive disorder    Personality disorder (HCC)    Psychogenic constipation    Sinus headache     Past Medical History, Surgical history, Social history, and Family history were reviewed and updated as appropriate.   Please see review of systems for further details on the patient's review from today.   Objective:   Physical Exam:  There were no vitals taken for this  visit.  Physical Exam Constitutional:      General: He is not in acute distress. Musculoskeletal:        General: No deformity.  Neurological:     Mental Status: He is alert and oriented to person, place, and time.     Coordination: Coordination normal.  Psychiatric:        Attention and Perception: Attention and perception normal. He does not perceive auditory or visual hallucinations.        Mood and Affect: Mood  normal. Mood is not anxious or depressed. Affect is not labile, blunt, angry or inappropriate.        Speech: Speech normal.        Behavior: Behavior normal.        Thought Content: Thought content normal. Thought content is not paranoid or delusional. Thought content does not include homicidal or suicidal ideation. Thought content does not include homicidal or suicidal plan.        Cognition and Memory: Cognition and memory normal.        Judgment: Judgment normal.     Comments: Insight intact     Lab Review:  No results found for: "NA", "K", "CL", "CO2", "GLUCOSE", "BUN", "CREATININE", "CALCIUM", "PROT", "ALBUMIN", "AST", "ALT", "ALKPHOS", "BILITOT", "GFRNONAA", "GFRAA"  No results found for: "WBC", "RBC", "HGB", "HCT", "PLT", "MCV", "MCH", "MCHC", "RDW", "LYMPHSABS", "MONOABS", "EOSABS", "BASOSABS"  No results found for: "POCLITH", "LITHIUM"   No results found for: "PHENYTOIN", "PHENOBARB", "VALPROATE", "CBMZ"   .res Assessment: Plan:    Plan:  PDMP reviewed  1. Adderall 20mg  BID - not taking currently 2. Pristiq 50mg  daily  RTC 4 months  Patient advised to contact office with any questions, adverse effects, or acute worsening in signs and symptoms.  Discussed potential benefits, risks, and side effects of stimulants with patient to include increased heart rate, palpitations, insomnia, increased anxiety, increased irritability, or decreased appetite.  Instructed patient to contact office if experiencing any significant tolerability issues.  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  Future Appointments  Date Time Provider Department  Center  06/17/2022  8:00 AM Chatham Howington, [facility], NP [facility] None    No orders of the defined types were placed in this encounter.   -------------------------------

## 2022-09-12 ENCOUNTER — Other Ambulatory Visit: Payer: Self-pay | Admitting: Adult Health

## 2022-09-12 DIAGNOSIS — F341 Dysthymic disorder: Secondary | ICD-10-CM

## 2022-09-12 DIAGNOSIS — F900 Attention-deficit hyperactivity disorder, predominantly inattentive type: Secondary | ICD-10-CM

## 2022-09-12 DIAGNOSIS — F605 Obsessive-compulsive personality disorder: Secondary | ICD-10-CM

## 2022-10-14 ENCOUNTER — Encounter: Payer: Self-pay | Admitting: Adult Health

## 2022-10-14 ENCOUNTER — Ambulatory Visit (INDEPENDENT_AMBULATORY_CARE_PROVIDER_SITE_OTHER): Payer: 59 | Admitting: Adult Health

## 2022-10-14 DIAGNOSIS — F810 Specific reading disorder: Secondary | ICD-10-CM

## 2022-10-14 DIAGNOSIS — F605 Obsessive-compulsive personality disorder: Secondary | ICD-10-CM

## 2022-10-14 DIAGNOSIS — F341 Dysthymic disorder: Secondary | ICD-10-CM

## 2022-10-14 DIAGNOSIS — F900 Attention-deficit hyperactivity disorder, predominantly inattentive type: Secondary | ICD-10-CM | POA: Diagnosis not present

## 2022-10-14 MED ORDER — DESVENLAFAXINE SUCCINATE ER 25 MG PO TB24: ORAL_TABLET | ORAL | 5 refills | Status: DC

## 2022-10-14 NOTE — Progress Notes (Signed)
Waite Mischler [id number] [birth year] 22 y.o.  Subjective:   Patient ID:  Charl Mengesha is a 22 y.o. (DOB 06-24-2001) male.  Chief Complaint: No chief complaint on file.   HPI Omran Nohr presents to the office today for follow-up of ADHD, OCPD, learning disorder, depression.  Describes mood today as "so-so". Pleasant. Denies tearfulness. Mood symptoms - reports decreased depression and irritability. Reports some anxiety. Reports worry, rumination and over thinking. Mood is lower. Stating "I'm not doing too good overall - having some good days". Has started working with a therapist and feels it is helpful. Currently taking Pristiq at '50mg'$  daily, but would like to reduce dose to '25mg'$  daily. Enrolled in school full time (Engineer, building services) and working full time. Living with mother - supportive. Varying interest and motivation. Taking  medications as prescribed. Has started working with a therapist. Energy levels varies. Active, does not have a regular exercise routine. Enjoys some usual interests and activities. Lives at home with mother. Family in [place]. Spending time with family. Appetite adequate. Weight stable - 215 pounds - 75". Sleeps better some nights than others. Averages 7 hours during the week and longer on the weekends.  Focus and concentration stable. Completing tasks. Managing aspects of household. 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  Denies AH or VH. Denies self harm. Denies substance use.   Previous medication trials: Vyvanse   Flowsheet Row ED from 01/02/2022 in [facility] at [place] No Risk        Review of Systems:  Review of Systems  Musculoskeletal:  Negative for gait problem.  Neurological:  Negative for tremors.  Psychiatric/Behavioral:         Please refer to HPI    Medications: I have reviewed the patient's current medications.  Current Outpatient Medications   Medication Sig Dispense Refill   cetirizine (ZYRTEC) 10 MG tablet Take 1 tablet (10 mg total) by mouth daily. 30 tablet 2   desvenlafaxine (PRISTIQ) 50 MG 24 hr tablet TAKE ONE TABLET BY MOUTH EVERY MORNING 30 tablet 1   fluticasone (FLONASE) 50 MCG/ACT nasal spray Place 1 spray into both nostrils daily. 16 g 2   ibuprofen (ADVIL) 800 MG tablet Take 1 tablet (800 mg total) by mouth 3 (three) times daily. 21 tablet 0   No current facility-administered medications for this visit.    Medication Side Effects: None  Allergies: No Known Allergies  Past Medical History:  Diagnosis Date   ADHD (attention deficit hyperactivity disorder)    Allergic rhinitis    Anxiety    Atopic eczema    Depression    Fatigue    Headache    Obsessive-compulsive disorder    Personality disorder (HCC)    Psychogenic constipation    Sinus headache     Past Medical History, Surgical history, Social history, and Family history were reviewed and updated as appropriate.   Please see review of systems for further details on the patient's review from today.   Objective:   Physical Exam:  There were no vitals taken for this visit.  Physical Exam Constitutional:      General: He is not in acute distress. Musculoskeletal:        General: No deformity.  Neurological:     Mental Status: He is alert and oriented to person, place, and time.     Coordination: Coordination normal.  Psychiatric:        Attention  and Perception: Attention and perception normal. He does not perceive auditory or visual hallucinations.        Mood and Affect: Mood normal. Mood is not anxious or depressed. Affect is not labile, blunt, angry or inappropriate.        Speech: Speech normal.        Behavior: Behavior normal.        Thought Content: Thought content normal. Thought content is not paranoid or delusional. Thought content does not include homicidal or suicidal ideation. Thought content does not include homicidal or  suicidal plan.        Cognition and Memory: Cognition and memory normal.        Judgment: Judgment normal.     Comments: Insight intact     Lab Review:  No results found for: "NA", "K", "CL", "CO2", "GLUCOSE", "BUN", "CREATININE", "CALCIUM", "PROT", "ALBUMIN", "AST", "ALT", "ALKPHOS", "BILITOT", "GFRNONAA", "GFRAA"  No results found for: "WBC", "RBC", "HGB", "HCT", "PLT", "MCV", "MCH", "MCHC", "RDW", "LYMPHSABS", "MONOABS", "EOSABS", "BASOSABS"  No results found for: "POCLITH", "LITHIUM"   No results found for: "PHENYTOIN", "PHENOBARB", "VALPROATE", "CBMZ"   .res Assessment: Plan:    Plan:  PDMP reviewed  Pristiq '50mg'$  daily  RTC 4 months  Patient advised to contact office with any questions, adverse effects, or acute worsening in signs and symptoms.  Discussed potential benefits, risks, and side effects of stimulants with patient to include increased heart rate, palpitations, insomnia, increased anxiety, increased irritability, or decreased appetite.  Instructed patient to contact office if experiencing any significant tolerability issues.   There are no diagnoses linked to this encounter.   Please see After Visit Summary for patient specific instructions.  No future appointments.  No orders of the defined types were placed in this encounter.   -------------------------------

## 2022-11-09 ENCOUNTER — Other Ambulatory Visit: Payer: Self-pay | Admitting: Adult Health

## 2022-11-09 DIAGNOSIS — F605 Obsessive-compulsive personality disorder: Secondary | ICD-10-CM

## 2022-11-09 DIAGNOSIS — F900 Attention-deficit hyperactivity disorder, predominantly inattentive type: Secondary | ICD-10-CM

## 2022-11-09 DIAGNOSIS — F341 Dysthymic disorder: Secondary | ICD-10-CM

## 2022-12-14 ENCOUNTER — Ambulatory Visit (INDEPENDENT_AMBULATORY_CARE_PROVIDER_SITE_OTHER): Payer: 59 | Admitting: Adult Health

## 2022-12-14 ENCOUNTER — Encounter: Payer: Self-pay | Admitting: Adult Health

## 2022-12-14 DIAGNOSIS — F810 Specific reading disorder: Secondary | ICD-10-CM | POA: Diagnosis not present

## 2022-12-14 DIAGNOSIS — F341 Dysthymic disorder: Secondary | ICD-10-CM | POA: Diagnosis not present

## 2022-12-14 DIAGNOSIS — F605 Obsessive-compulsive personality disorder: Secondary | ICD-10-CM | POA: Diagnosis not present

## 2022-12-14 DIAGNOSIS — F900 Attention-deficit hyperactivity disorder, predominantly inattentive type: Secondary | ICD-10-CM

## 2022-12-14 NOTE — Progress Notes (Addendum)
Lukis Massar [id number] [birth year] 22 y.o.  Subjective:   Patient ID:  Justin Hogan is a 22 y.o. (DOB 2001-05-01) male.  Chief Complaint: No chief complaint on file.   HPI Arvester Brandow presents to the office today for follow-up of ADHD, OCPD, learning disorder, depression.  Describes mood today as "so-so". Pleasant. Denies tearfulness. Mood symptoms - denies depression, anxiety and irritability. Denies worry, rumination and over thinking. Mood is consistent. Stating "I feel like like I'm doing alright". Stopped Pristiq 50mg  2 weeks ago. Reports some "brain zaps". Working with a therapist every 2 weeks and feels it is helpful. Living with mother - supportive. Varying interest and motivation. Taking  medications as prescribed. Has started working with a therapist. Energy levels varies. Active, does not have a regular exercise routine. Enjoys some usual interests and activities. Lives at home with mother. Family in [place]. Spending time with family. Appetite adequate. Weight stable - 215 pounds - 75". Sleeps better some nights than others. Averages 7 hours during the week and longer on the weekends.  Focus and concentration stable. Completing tasks. 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  Denies AH or VH. Denies self harm. Denies substance use.   Previous medication trials: Vyvanse   Flowsheet Row ED from 01/02/2022 in [facility] at [facility] RISK CATEGORY No Risk        Review of Systems:  Review of Systems  Musculoskeletal:  Negative for gait problem.  Neurological:  Negative for tremors.  Psychiatric/Behavioral:         Please refer to HPI    Medications: I have reviewed the patient's current medications.  Current Outpatient Medications  Medication Sig Dispense Refill   cetirizine (ZYRTEC) 10 MG tablet Take 1 tablet (10 mg total) by mouth daily. 30 tablet 2    desvenlafaxine (PRISTIQ) 25 MG 24 hr tablet TAKE ONE TABLET BY MOUTH EVERY MORNING 30 tablet 5   desvenlafaxine (PRISTIQ) 50 MG 24 hr tablet TAKE 1 TABLET BY MOUTH EVERY MORNING 30 tablet 1   fluticasone (FLONASE) 50 MCG/ACT nasal spray Place 1 spray into both nostrils daily. 16 g 2   ibuprofen (ADVIL) 800 MG tablet Take 1 tablet (800 mg total) by mouth 3 (three) times daily. 21 tablet 0   No current facility-administered medications for this visit.    Medication Side Effects: None  Allergies: No Known Allergies  Past Medical History:  Diagnosis Date   ADHD (attention deficit hyperactivity disorder)    Allergic rhinitis    Anxiety    Atopic eczema    Depression    Fatigue    Headache    Obsessive-compulsive disorder    Personality disorder (HCC)    Psychogenic constipation    Sinus headache     Past Medical History, Surgical history, Social history, and Family history were reviewed and updated as appropriate.   Please see review of systems for further details on the patient's review from today.   Objective:   Physical Exam:  There were no vitals taken for this visit.  Physical Exam Constitutional:      General: He is not in acute distress. Musculoskeletal:        General: No deformity.  Neurological:     Mental Status: He is alert and oriented to person, place, and time.     Coordination: Coordination normal.  Psychiatric:        Attention and Perception: Attention  and perception normal. He does not perceive auditory or visual hallucinations.        Mood and Affect: Mood normal. Mood is not anxious or depressed. Affect is not labile, blunt, angry or inappropriate.        Speech: Speech normal.        Behavior: Behavior normal.        Thought Content: Thought content normal. Thought content is not paranoid or delusional. Thought content does not include homicidal or suicidal ideation. Thought content does not include homicidal or suicidal plan.        Cognition and  Memory: Cognition and memory normal.        Judgment: Judgment normal.     Comments: Insight intact     Lab Review:  No results found for: "NA", "K", "CL", "CO2", "GLUCOSE", "BUN", "CREATININE", "CALCIUM", "PROT", "ALBUMIN", "AST", "ALT", "ALKPHOS", "BILITOT", "GFRNONAA", "GFRAA"  No results found for: "WBC", "RBC", "HGB", "HCT", "PLT", "MCV", "MCH", "MCHC", "RDW", "LYMPHSABS", "MONOABS", "EOSABS", "BASOSABS"  No results found for: "POCLITH", "LITHIUM"   No results found for: "PHENYTOIN", "PHENOBARB", "VALPROATE", "CBMZ"   .res Assessment: Plan:    Plan:  PDMP reviewed  D/C Pristiq 50mg  daily - stopped taking 2 weeks ago.  RTC 3 months  Patient advised to contact office with any questions, adverse effects, or acute worsening in signs and symptoms.  Discussed potential benefits, risks, and side effects of stimulants with patient to include increased heart rate, palpitations, insomnia, increased anxiety, increased irritability, or decreased appetite.  Instructed patient to contact office if experiencing any significant tolerability issues.   There are no diagnoses linked to this encounter.   Please see After Visit Summary for patient specific instructions.  No future appointments.   No orders of the defined types were placed in this encounter.   -------------------------------

## 2023-03-17 ENCOUNTER — Ambulatory Visit (INDEPENDENT_AMBULATORY_CARE_PROVIDER_SITE_OTHER): Payer: Self-pay | Admitting: Adult Health

## 2023-03-17 DIAGNOSIS — Z0389 Encounter for observation for other suspected diseases and conditions ruled out: Secondary | ICD-10-CM

## 2023-03-17 NOTE — Progress Notes (Signed)
Patient no show appointment. ? ?

## 2024-06-10 ENCOUNTER — Encounter: Payer: Self-pay | Admitting: Adult Health

## 2024-06-10 ENCOUNTER — Telehealth (INDEPENDENT_AMBULATORY_CARE_PROVIDER_SITE_OTHER): Admitting: Adult Health

## 2024-06-10 DIAGNOSIS — F909 Attention-deficit hyperactivity disorder, unspecified type: Secondary | ICD-10-CM | POA: Diagnosis not present

## 2024-06-10 DIAGNOSIS — F32A Depression, unspecified: Secondary | ICD-10-CM | POA: Diagnosis not present

## 2024-06-10 DIAGNOSIS — F605 Obsessive-compulsive personality disorder: Secondary | ICD-10-CM

## 2024-06-10 DIAGNOSIS — F819 Developmental disorder of scholastic skills, unspecified: Secondary | ICD-10-CM | POA: Diagnosis not present

## 2024-06-10 DIAGNOSIS — F341 Dysthymic disorder: Secondary | ICD-10-CM

## 2024-06-10 DIAGNOSIS — F810 Specific reading disorder: Secondary | ICD-10-CM

## 2024-06-10 MED ORDER — SERTRALINE HCL 50 MG PO TABS: 50.0000 mg | ORAL_TABLET | Freq: Every day | ORAL | 2 refills | Status: DC
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
## 2024-09-11 ENCOUNTER — Other Ambulatory Visit: Payer: Self-pay | Admitting: Adult Health

## 2024-09-11 DIAGNOSIS — F341 Dysthymic disorder: Secondary | ICD-10-CM

## 2024-09-11 DIAGNOSIS — F605 Obsessive-compulsive personality disorder: Secondary | ICD-10-CM

## 2024-09-11 DIAGNOSIS — F810 Specific reading disorder: Secondary | ICD-10-CM

## 2024-09-11 NOTE — Telephone Encounter (Signed)
 Good Morning,   Please call pt to schedule next appt., was due to return in Dec.   Thank you
